# Patient Record
Sex: Female | Born: 2000 | Race: White | Hispanic: No | Marital: Single | Smoking: Never smoker
Health system: Southern US, Community
[De-identification: ages and names within clinical notes are randomized; demographics above are authoritative.]

---
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
## 2021-08-15 ENCOUNTER — Other Ambulatory Visit (HOSPITAL_BASED_OUTPATIENT_CLINIC_OR_DEPARTMENT_OTHER): Payer: Self-pay

## 2021-08-15 MED ORDER — INFLUENZA VAC SPLIT QUAD 0.5 ML IM SUSY
PREFILLED_SYRINGE | INTRAMUSCULAR | 0 refills | Status: AC
  Filled 2021-08-15: qty 0.5, 1d supply, fill #0

## 2021-10-18 ENCOUNTER — Other Ambulatory Visit: Payer: Self-pay

## 2021-10-18 ENCOUNTER — Ambulatory Visit
Admission: RE | Admit: 2021-10-18 | Discharge: 2021-10-18 | Disposition: A | Discharge status: Home / Self Care | Payer: Self-pay | Source: Ambulatory Visit | Attending: Physician Assistant | Admitting: Physician Assistant

## 2021-10-18 VITALS — BP 163/100 | HR 86 | Temp 98.9°F | Resp 18

## 2021-10-18 DIAGNOSIS — J209 Acute bronchitis, unspecified: Secondary | ICD-10-CM

## 2021-10-18 MED ORDER — PREDNISONE 20 MG PO TABS: 40.0000 mg | ORAL_TABLET | Freq: Every day | ORAL | 0 refills | Status: AC

## 2021-10-18 MED ORDER — AZITHROMYCIN 250 MG PO TABS: 250.0000 mg | ORAL_TABLET | Freq: Every day | ORAL | 0 refills | Status: AC

## 2021-10-18 NOTE — ED Triage Notes (Signed)
One week h/o productive cough and congestion. Cough is worse in the morning and at night. Notes some intermittent hoarseness. Has been taking mucinex and other otc cough meds with temporary relief. Afebrile. No v/d.

## 2021-10-18 NOTE — ED Provider Notes (Signed)
[facility]    CSN: [id number] Arrival date & time: 10/18/21  0955      History   Chief Complaint Chief Complaint  Patient presents with   Cough    HPI Jamie Giles is a 20 y.o. female.   Patient here today for evaluation of cough that she has had for the last week.  She states cough seems to be worse in the morning and at night.  She has tried Mucinex and other over-the-counter medication without relief.  She has not had fever.  She denies any vomiting or diarrhea.  The history is provided by the patient.   History reviewed. No pertinent past medical history.  There are no problems to display for this patient.   History reviewed. No pertinent surgical history.  OB History   No obstetric history on file.      Home Medications    Prior to Admission medications   Medication Sig Start Date End Date Taking? Authorizing Provider  azithromycin (ZITHROMAX) 250 MG tablet Take 1 tablet (250 mg total) by mouth daily. Take first 2 tablets together, then 1 every day until finished. 10/18/21  Yes Tomi Bamberger, PA-C  predniSONE (DELTASONE) 20 MG tablet Take 2 tablets (40 mg total) by mouth daily with breakfast for 5 days. 10/18/21 10/23/21 Yes Tomi Bamberger, PA-C  influenza vac split quadrivalent PF (FLUARIX) 0.5 ML injection Inject into the muscle. 08/15/21   Judyann Munson, MD    Family History History reviewed. No pertinent family history.  Social History Social History   Tobacco Use   Smoking status: Never   Smokeless tobacco: Never     Allergies   Patient has no known allergies.   Review of Systems Review of Systems  Constitutional:  Negative for chills and fever.  HENT:  Positive for congestion. Negative for ear pain and sore throat.   Eyes:  Negative for discharge and redness.  Respiratory:  Positive for cough. Negative for shortness of breath and wheezing.   Gastrointestinal:  Negative for abdominal pain, diarrhea, nausea and vomiting.     Physical Exam Triage Vital Signs ED Triage Vitals  Enc Vitals Group     BP      Pulse      Resp      Temp      Temp src      SpO2      Weight      Height      Head Circumference      Peak Flow      Pain Score      Pain Loc      Pain Edu?      Excl. in GC?    No data found.  Updated Vital Signs BP (!) 163/100 (BP Location: Right Arm)    Pulse 86    Temp 98.9 F (37.2 C) (Oral)    Resp 18    SpO2 96%   Physical Exam Vitals and nursing note reviewed.  Constitutional:      General: She is not in acute distress.    Appearance: Normal appearance. She is not ill-appearing.  HENT:     Head: Normocephalic and atraumatic.     Nose: Congestion present.     Mouth/Throat:     Mouth: Mucous membranes are moist.     Pharynx: No oropharyngeal exudate or posterior oropharyngeal erythema.  Eyes:     Conjunctiva/sclera: Conjunctivae normal.  Cardiovascular:     Rate and Rhythm: Normal rate and  regular rhythm.     Heart sounds: Normal heart sounds. No murmur heard. Pulmonary:     Effort: Pulmonary effort is normal. No respiratory distress.     Breath sounds: Normal breath sounds. No wheezing, rhonchi or rales.  Skin:    General: Skin is warm and dry.  Neurological:     Mental Status: She is alert.  Psychiatric:        Mood and Affect: Mood normal.        Thought Content: Thought content normal.     UC Treatments / Results  Labs (all labs ordered are listed, but only abnormal results are displayed) Labs Reviewed - No data to display  EKG   Radiology No results found.  Procedures Procedures (including critical care time)  Medications Ordered in UC Medications - No data to display  Initial Impression / Assessment and Plan / UC Course  I have reviewed the triage vital signs and the nursing notes.  Pertinent labs & imaging results that were available during my care of the patient were reviewed by me and considered in my medical decision making (see chart for  details).    Suspect symptoms are likely related to bronchitis.  Will treat with steroid burst as well as Zpak to cover atypical pna and recommended follow-up if no gradual improvement.  Final Clinical Impressions(s) / UC Diagnoses   Final diagnoses:  Acute bronchitis, unspecified organism   Discharge Instructions   None    ED Prescriptions     Medication Sig Dispense Auth. Provider   predniSONE (DELTASONE) 20 MG tablet Take 2 tablets (40 mg total) by mouth daily with breakfast for 5 days. 10 tablet Erma Pinto F, PA-C   azithromycin (ZITHROMAX) 250 MG tablet Take 1 tablet (250 mg total) by mouth daily. Take first 2 tablets together, then 1 every day until finished. 6 tablet Tomi Bamberger, PA-C      PDMP not reviewed this encounter.   Tomi Bamberger, PA-C 10/18/21 1402

## 2022-03-01 ENCOUNTER — Other Ambulatory Visit: Payer: Self-pay

## 2022-03-01 ENCOUNTER — Emergency Department (HOSPITAL_COMMUNITY)
Admission: EM | Admit: 2022-03-01 | Discharge: 2022-03-01 | Disposition: A | Discharge status: Home / Self Care | Payer: 59 | Attending: Emergency Medicine | Admitting: Emergency Medicine

## 2022-03-01 ENCOUNTER — Encounter (HOSPITAL_COMMUNITY): Payer: Self-pay

## 2022-03-01 DIAGNOSIS — Y92410 Unspecified street and highway as the place of occurrence of the external cause: Secondary | ICD-10-CM | POA: Insufficient documentation

## 2022-03-01 DIAGNOSIS — S199XXA Unspecified injury of neck, initial encounter: Secondary | ICD-10-CM | POA: Diagnosis present

## 2022-03-01 MED ORDER — METHOCARBAMOL 500 MG PO TABS: 500.0000 mg | ORAL_TABLET | Freq: Two times a day (BID) | ORAL | 0 refills | Status: AC

## 2022-03-01 NOTE — ED Notes (Signed)
An After Visit Summary was printed and given to the patient. Discharge instructions given and no further questions at this time.  
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
## 2022-08-20 ENCOUNTER — Other Ambulatory Visit (HOSPITAL_BASED_OUTPATIENT_CLINIC_OR_DEPARTMENT_OTHER): Payer: Self-pay

## 2022-08-20 MED ORDER — INFLUENZA VAC SPLIT QUAD 0.5 ML IM SUSY
PREFILLED_SYRINGE | INTRAMUSCULAR | 0 refills | Status: AC
  Filled 2022-08-20: qty 0.5, 1d supply, fill #0

## 2023-02-27 ENCOUNTER — Ambulatory Visit: Payer: Self-pay

## 2023-08-21 ENCOUNTER — Other Ambulatory Visit (HOSPITAL_BASED_OUTPATIENT_CLINIC_OR_DEPARTMENT_OTHER): Payer: Self-pay

## 2023-08-21 MED ORDER — INFLUENZA VIRUS VACC SPLIT PF (FLUZONE) 0.5 ML IM SUSY
0.5000 mL | PREFILLED_SYRINGE | Freq: Once | INTRAMUSCULAR | 0 refills | Status: AC
  Filled 2023-08-21: qty 0.5, 1d supply, fill #0

## 2024-07-17 ENCOUNTER — Other Ambulatory Visit (HOSPITAL_BASED_OUTPATIENT_CLINIC_OR_DEPARTMENT_OTHER): Payer: Self-pay

## 2024-07-17 MED ORDER — FLUZONE 0.5 ML IM SUSY
0.5000 mL | PREFILLED_SYRINGE | Freq: Once | INTRAMUSCULAR | 0 refills | Status: AC
  Filled 2024-07-17: qty 0.5, 1d supply, fill #0
